# Patient Record
Sex: Male | Born: 1955 | Race: White | Hispanic: No | Marital: Married | Smoking: Never smoker
Health system: Southern US, Community
[De-identification: ages and names within clinical notes are randomized; demographics above are authoritative.]

## PROBLEM LIST (undated history)

## (undated) DIAGNOSIS — E785 Hyperlipidemia, unspecified: Secondary | ICD-10-CM

## (undated) DIAGNOSIS — F1010 Alcohol abuse, uncomplicated: Secondary | ICD-10-CM

## (undated) DIAGNOSIS — I447 Left bundle-branch block, unspecified: Secondary | ICD-10-CM

## (undated) DIAGNOSIS — F32A Depression, unspecified: Secondary | ICD-10-CM

## (undated) DIAGNOSIS — E349 Endocrine disorder, unspecified: Secondary | ICD-10-CM

## (undated) DIAGNOSIS — K222 Esophageal obstruction: Secondary | ICD-10-CM

## (undated) DIAGNOSIS — G4700 Insomnia, unspecified: Secondary | ICD-10-CM

## (undated) DIAGNOSIS — I10 Essential (primary) hypertension: Secondary | ICD-10-CM

## (undated) DIAGNOSIS — K2270 Barrett's esophagus without dysplasia: Secondary | ICD-10-CM

## (undated) DIAGNOSIS — B029 Zoster without complications: Secondary | ICD-10-CM

## (undated) DIAGNOSIS — Z8719 Personal history of other diseases of the digestive system: Secondary | ICD-10-CM

## (undated) DIAGNOSIS — K5792 Diverticulitis of intestine, part unspecified, without perforation or abscess without bleeding: Secondary | ICD-10-CM

## (undated) DIAGNOSIS — K219 Gastro-esophageal reflux disease without esophagitis: Secondary | ICD-10-CM

## (undated) DIAGNOSIS — K635 Polyp of colon: Secondary | ICD-10-CM

## (undated) DIAGNOSIS — M542 Cervicalgia: Secondary | ICD-10-CM

## (undated) DIAGNOSIS — J45909 Unspecified asthma, uncomplicated: Secondary | ICD-10-CM

## (undated) DIAGNOSIS — F329 Major depressive disorder, single episode, unspecified: Secondary | ICD-10-CM

## (undated) DIAGNOSIS — K759 Inflammatory liver disease, unspecified: Secondary | ICD-10-CM

## (undated) HISTORY — PX: NEUROPLASTY / TRANSPOSITION ULNAR NERVE AT ELBOW: SUR895

## (undated) HISTORY — PX: COLONOSCOPY: SHX5424

## (undated) HISTORY — PX: NEURECTOMY CERVICAL: SUR886

---

## 2016-03-25 ENCOUNTER — Encounter: Payer: Self-pay | Admitting: Anesthesiology

## 2016-03-25 ENCOUNTER — Encounter
Admission: RE | Disposition: A | Discharge status: Home / Self Care | Payer: Self-pay | Source: Ambulatory Visit | Attending: Gastroenterology

## 2016-03-25 ENCOUNTER — Ambulatory Visit
Admission: RE | Admit: 2016-03-25 | Discharge: 2016-03-25 | Disposition: A | Discharge status: Home / Self Care | Payer: BLUE CROSS/BLUE SHIELD | Source: Ambulatory Visit | Attending: Gastroenterology | Admitting: Gastroenterology

## 2016-03-25 ENCOUNTER — Ambulatory Visit: Payer: BLUE CROSS/BLUE SHIELD | Admitting: Anesthesiology

## 2016-03-25 DIAGNOSIS — E785 Hyperlipidemia, unspecified: Secondary | ICD-10-CM | POA: Diagnosis not present

## 2016-03-25 DIAGNOSIS — K5730 Diverticulosis of large intestine without perforation or abscess without bleeding: Secondary | ICD-10-CM | POA: Diagnosis not present

## 2016-03-25 DIAGNOSIS — K2980 Duodenitis without bleeding: Secondary | ICD-10-CM | POA: Insufficient documentation

## 2016-03-25 DIAGNOSIS — Z888 Allergy status to other drugs, medicaments and biological substances status: Secondary | ICD-10-CM | POA: Diagnosis not present

## 2016-03-25 DIAGNOSIS — Z86010 Personal history of colonic polyps: Secondary | ICD-10-CM | POA: Diagnosis not present

## 2016-03-25 DIAGNOSIS — K219 Gastro-esophageal reflux disease without esophagitis: Secondary | ICD-10-CM | POA: Insufficient documentation

## 2016-03-25 DIAGNOSIS — K2270 Barrett's esophagus without dysplasia: Secondary | ICD-10-CM | POA: Insufficient documentation

## 2016-03-25 DIAGNOSIS — Z91048 Other nonmedicinal substance allergy status: Secondary | ICD-10-CM | POA: Diagnosis not present

## 2016-03-25 DIAGNOSIS — F329 Major depressive disorder, single episode, unspecified: Secondary | ICD-10-CM | POA: Insufficient documentation

## 2016-03-25 DIAGNOSIS — R194 Change in bowel habit: Secondary | ICD-10-CM | POA: Diagnosis present

## 2016-03-25 DIAGNOSIS — Z885 Allergy status to narcotic agent status: Secondary | ICD-10-CM | POA: Diagnosis not present

## 2016-03-25 DIAGNOSIS — Z79899 Other long term (current) drug therapy: Secondary | ICD-10-CM | POA: Diagnosis not present

## 2016-03-25 DIAGNOSIS — K2950 Unspecified chronic gastritis without bleeding: Secondary | ICD-10-CM | POA: Insufficient documentation

## 2016-03-25 DIAGNOSIS — G4700 Insomnia, unspecified: Secondary | ICD-10-CM | POA: Diagnosis not present

## 2016-03-25 DIAGNOSIS — K449 Diaphragmatic hernia without obstruction or gangrene: Secondary | ICD-10-CM | POA: Insufficient documentation

## 2016-03-25 DIAGNOSIS — I10 Essential (primary) hypertension: Secondary | ICD-10-CM | POA: Insufficient documentation

## 2016-03-25 DIAGNOSIS — R197 Diarrhea, unspecified: Secondary | ICD-10-CM | POA: Insufficient documentation

## 2016-03-25 DIAGNOSIS — R110 Nausea: Secondary | ICD-10-CM | POA: Insufficient documentation

## 2016-03-25 DIAGNOSIS — J45909 Unspecified asthma, uncomplicated: Secondary | ICD-10-CM | POA: Insufficient documentation

## 2016-03-25 DIAGNOSIS — Z8619 Personal history of other infectious and parasitic diseases: Secondary | ICD-10-CM | POA: Insufficient documentation

## 2016-03-25 DIAGNOSIS — K3189 Other diseases of stomach and duodenum: Secondary | ICD-10-CM | POA: Diagnosis not present

## 2016-03-25 HISTORY — DX: Alcohol abuse, uncomplicated: F10.10

## 2016-03-25 HISTORY — PX: ESOPHAGOGASTRODUODENOSCOPY (EGD) WITH PROPOFOL: SHX5813

## 2016-03-25 HISTORY — DX: Esophageal obstruction: K22.2

## 2016-03-25 HISTORY — DX: Gastro-esophageal reflux disease without esophagitis: K21.9

## 2016-03-25 HISTORY — DX: Major depressive disorder, single episode, unspecified: F32.9

## 2016-03-25 HISTORY — DX: Insomnia, unspecified: G47.00

## 2016-03-25 HISTORY — DX: Depression, unspecified: F32.A

## 2016-03-25 HISTORY — DX: Unspecified asthma, uncomplicated: J45.909

## 2016-03-25 HISTORY — DX: Cervicalgia: M54.2

## 2016-03-25 HISTORY — DX: Hyperlipidemia, unspecified: E78.5

## 2016-03-25 HISTORY — DX: Endocrine disorder, unspecified: E34.9

## 2016-03-25 HISTORY — DX: Barrett's esophagus without dysplasia: K22.70

## 2016-03-25 HISTORY — PX: COLONOSCOPY WITH PROPOFOL: SHX5780

## 2016-03-25 HISTORY — DX: Diverticulitis of intestine, part unspecified, without perforation or abscess without bleeding: K57.92

## 2016-03-25 HISTORY — DX: Personal history of other diseases of the digestive system: Z87.19

## 2016-03-25 HISTORY — DX: Polyp of colon: K63.5

## 2016-03-25 HISTORY — DX: Inflammatory liver disease, unspecified: K75.9

## 2016-03-25 HISTORY — DX: Essential (primary) hypertension: I10

## 2016-03-25 HISTORY — DX: Left bundle-branch block, unspecified: I44.7

## 2016-03-25 HISTORY — DX: Zoster without complications: B02.9

## 2016-03-25 LAB — CBC WITH DIFFERENTIAL/PLATELET
BASOS ABS: 0 10*3/uL (ref 0–0.1)
BASOS PCT: 1 %
EOS ABS: 0.1 10*3/uL (ref 0–0.7)
EOS PCT: 1 %
HCT: 42.3 % (ref 40.0–52.0)
Hemoglobin: 14.8 g/dL (ref 13.0–18.0)
Lymphocytes Relative: 22 %
Lymphs Abs: 1.1 10*3/uL (ref 1.0–3.6)
MCH: 32.4 pg (ref 26.0–34.0)
MCHC: 35 g/dL (ref 32.0–36.0)
MCV: 92.4 fL (ref 80.0–100.0)
MONO ABS: 0.5 10*3/uL (ref 0.2–1.0)
Monocytes Relative: 9 %
Neutro Abs: 3.5 10*3/uL (ref 1.4–6.5)
Neutrophils Relative %: 67 %
PLATELETS: 292 10*3/uL (ref 150–440)
RBC: 4.58 MIL/uL (ref 4.40–5.90)
RDW: 12.9 % (ref 11.5–14.5)
WBC: 5.2 10*3/uL (ref 3.8–10.6)

## 2016-03-25 LAB — PROTIME-INR
INR: 1
PROTHROMBIN TIME: 13.2 s (ref 11.4–15.2)

## 2016-03-25 SURGERY — COLONOSCOPY WITH PROPOFOL
Anesthesia: General

## 2016-03-25 MED ORDER — LIDOCAINE 2% (20 MG/ML) 5 ML SYRINGE
INTRAMUSCULAR | Status: DC | PRN
Start: 2016-03-25 — End: 2016-03-25
  Administered 2016-03-25: 25 mg via INTRAVENOUS

## 2016-03-25 MED ORDER — IPRATROPIUM-ALBUTEROL 0.5-2.5 (3) MG/3ML IN SOLN
3.0000 mL | Freq: Once | RESPIRATORY_TRACT | Status: AC
  Administered 2016-03-25: 0.03 mL via RESPIRATORY_TRACT
  Filled 2016-03-25: qty 3

## 2016-03-25 MED ORDER — PROPOFOL 500 MG/50ML IV EMUL
INTRAVENOUS | Status: AC
  Filled 2016-03-25: qty 50

## 2016-03-25 MED ORDER — FENTANYL CITRATE (PF) 100 MCG/2ML IJ SOLN
INTRAMUSCULAR | Status: DC | PRN
  Administered 2016-03-25: 50 ug via INTRAVENOUS

## 2016-03-25 MED ORDER — GLYCOPYRROLATE 0.2 MG/ML IJ SOLN
INTRAMUSCULAR | Status: AC
  Filled 2016-03-25: qty 1

## 2016-03-25 MED ORDER — PROPOFOL 500 MG/50ML IV EMUL
INTRAVENOUS | Status: DC | PRN
  Administered 2016-03-25: 120 ug/kg/min via INTRAVENOUS

## 2016-03-25 MED ORDER — SODIUM CHLORIDE 0.9 % IV SOLN: INTRAVENOUS | Status: DC

## 2016-03-25 MED ORDER — MIDAZOLAM HCL 2 MG/2ML IJ SOLN
INTRAMUSCULAR | Status: AC
  Filled 2016-03-25: qty 2

## 2016-03-25 MED ORDER — FENTANYL CITRATE (PF) 100 MCG/2ML IJ SOLN
INTRAMUSCULAR | Status: AC
  Filled 2016-03-25: qty 2

## 2016-03-25 MED ORDER — SODIUM CHLORIDE 0.9 % IV SOLN
INTRAVENOUS | Status: DC
  Administered 2016-03-25: 1000 mL via INTRAVENOUS

## 2016-03-25 MED ORDER — MIDAZOLAM HCL 5 MG/5ML IJ SOLN
INTRAMUSCULAR | Status: DC | PRN
  Administered 2016-03-25: 1 mg via INTRAVENOUS

## 2016-03-25 MED ORDER — LIDOCAINE HCL (PF) 1 % IJ SOLN
INTRAMUSCULAR | Status: AC
  Filled 2016-03-25: qty 2

## 2016-03-25 MED ORDER — PROPOFOL 10 MG/ML IV BOLUS
INTRAVENOUS | Status: DC | PRN
Start: 2016-03-25 — End: 2016-03-25
  Administered 2016-03-25: 70 mg via INTRAVENOUS
  Administered 2016-03-25: 30 mg via INTRAVENOUS

## 2016-03-25 MED ORDER — GLYCOPYRROLATE 0.2 MG/ML IJ SOLN
INTRAMUSCULAR | Status: DC | PRN
  Administered 2016-03-25: 0.2 mg via INTRAVENOUS

## 2016-03-25 MED ORDER — SODIUM CHLORIDE 0.9 % IV SOLN
INTRAVENOUS | Status: DC
  Administered 2016-03-25: 0.03 mL via INTRAVENOUS

## 2016-03-25 NOTE — Op Note (Addendum)
[facility] Patient Name: Earl Stewart Procedure Date: 03/25/2016 10:02 AM MRN: [id number] Account #: [id number] Date of Birth: 09/10/1955 Admit Type: Outpatient Age: 61 Room: [facility] ENDO ROOM 3 Gender: Male Note Status: Finalized Procedure:            Upper GI endoscopy Indications:          Dyspepsia, Follow-up of Barrett's esophagus, Nausea                        with vomiting Providers:            Christena Deem, MD Referring MD:         [name] (Referring MD) Medicines:            Monitored Anesthesia Care Complications:        No immediate complications. Procedure:            Pre-Anesthesia Assessment:                       - ASA Grade Assessment: III - A patient with severe                        systemic disease.                       After obtaining informed consent, the endoscope was                        passed under direct vision. Throughout the procedure,                        the patient's blood pressure, pulse, and oxygen                        saturations were monitored continuously. The Endoscope                        was introduced through the mouth, and advanced to the                        third part of duodenum. The upper GI endoscopy was                        accomplished without difficulty. The patient tolerated                        the procedure well. Findings:      There were esophageal mucosal changes consistent with short-segment       Barrett's esophagus present at the gastroesophageal junction. The       maximum longitudinal extent of these mucosal changes was 1 cm in length.       Mucosa was biopsied with a cold forceps for histology in 4 quadrants.       One specimen bottle was sent to pathology.      The exam of the esophagus was otherwise normal.      Diffuse mild inflammation characterized by congestion (edema) and       erythema was found in the gastric body. Biopsies were taken with a cold        forceps for histology. Biopsies were taken with a cold forceps for  Helicobacter pylori testing.      Patchy mild inflammation characterized by erythema was found in the       duodenal bulb.      Diffuse mucosal flattening was found in the second portion of the       duodenum and in the third portion of the duodenum. Biopsies were taken       with a cold forceps for histology.      --Initial biopsies of the antrum were mistakenly placed in the duodenum       speciman jar. Repeat taken. Impression:           - Esophageal mucosal changes consistent with                        short-segment Barrett's esophagus. Biopsied.                       - Gastritis. Biopsied.                       - Duodenitis.                       - Flattened mucosa was found in the duodenum, rule out                        celiac disease. Biopsied. Recommendation:       - Continue present medications. Procedure Code(s):    --- Professional ---                       [phone], Esophagogastroduodenoscopy, flexible, transoral;                        with biopsy, single or multiple Diagnosis Code(s):    --- Professional ---                       K22.70, Barrett's esophagus without dysplasia                       K29.70, Gastritis, unspecified, without bleeding                       K29.80, Duodenitis without bleeding                       K31.89, Other diseases of stomach and duodenum                       R10.13, Epigastric pain                       R11.2, Nausea with vomiting, unspecified CPT copyright 2016 American Medical Association. All rights reserved. The codes documented in this report are preliminary and upon coder review may  be revised to meet current compliance requirements. Christena DeemMartin U Loel Betancur, MD 03/25/2016 10:33:26 AM This report has been signed electronically. Number of Addenda: 0 Note Initiated On: 03/25/2016 10:02 AM      [facility]

## 2016-03-25 NOTE — Anesthesia Postprocedure Evaluation (Signed)
Anesthesia Post Note  Patient: Earl JulianWilliam Stewart  Procedure(s) Performed: Procedure(s) (LRB): COLONOSCOPY WITH PROPOFOL (N/A) ESOPHAGOGASTRODUODENOSCOPY (EGD) WITH PROPOFOL (N/A)  Patient location during evaluation: Endoscopy Anesthesia Type: General Level of consciousness: awake and alert Pain management: pain level controlled Vital Signs Assessment: post-procedure vital signs reviewed and stable Respiratory status: spontaneous breathing, nonlabored ventilation, respiratory function stable and patient connected to nasal cannula oxygen Cardiovascular status: blood pressure returned to baseline and stable Postop Assessment: no signs of nausea or vomiting Anesthetic complications: no     Last Vitals:  Vitals:   03/25/16 1114 03/25/16 1134  BP:    Pulse: 72 64  Resp: (!) 21 11  Temp:      Last Pain:  Vitals:   03/25/16 1109  TempSrc: Tympanic                 Cleda MccreedyJoseph K Sheanna Dail

## 2016-03-25 NOTE — H&P (Signed)
Outpatient short stay form Pre-procedure 03/25/2016 10:03 AM Christena Deem MD  Primary Physician: Dr. [name]  Reason for visit:  EGD and colonoscopy  History of present illness:  Patient is a 61 year old male presenting today as above. He has about a 6 month history of waking in the morning with nausea and dry heaving last from one to 3 hours. Does have issues with sinus drainage. Also had a bowel habit change that is quite variable sometimes loose sometimes not. He has not seen any blood or black stools. He tolerated his prep well. He takes no aspirin or blood thinning agents. He has a history of hepatitis C that was treated about 20 years ago with interferon and ribavirin and this did give him sustained remission. INR today was 1.0, CBC showed a normal counts with platelets of 292. Further patient does have a history of Barrett's esophagus seen on his previous endoscopy. He has no dysphagia currently and is taking a daily proton pump inhibitor.    Current Facility-Administered Medications:  .  0.9 %  sodium chloride infusion, , Intravenous, Continuous, Christena Deem, MD, Last Rate: 20 mL/hr at 03/25/16 0907, 0.03 mL at 03/25/16 0907 .  0.9 %  sodium chloride infusion, , Intravenous, Continuous, Christena Deem, MD, Last Rate: 20 mL/hr at 03/25/16 0906, 1,000 mL at 03/25/16 0906 .  0.9 %  sodium chloride infusion, , Intravenous, Continuous, Christena Deem, MD .  lidocaine (PF) (XYLOCAINE) 1 % injection, , , ,   Prescriptions Prior to Admission  Medication Sig Dispense Refill Last Dose  . albuterol (PROVENTIL HFA;VENTOLIN HFA) 108 (90 Base) MCG/ACT inhaler Inhale 2 puffs into the lungs every 6 (six) hours as needed for wheezing or shortness of breath.     Marland Kitchen HYDROcodone-acetaminophen (NORCO/VICODIN) 5-325 MG tablet Take 1 tablet by mouth every 8 (eight) hours as needed for moderate pain.     Marland Kitchen lisinopril (PRINIVIL,ZESTRIL) 20 MG tablet Take 20 mg by mouth daily.   0530  .  omeprazole (PRILOSEC) 20 MG capsule Take 20 mg by mouth 2 (two) times daily before a meal.     . simvastatin (ZOCOR) 40 MG tablet Take 40 mg by mouth daily.     . traZODone (DESYREL) 50 MG tablet Take 50-100 mg by mouth at bedtime.     Marland Kitchen zolpidem (AMBIEN) 10 MG tablet Take 10 mg by mouth at bedtime as needed for sleep.        Allergies  Allergen Reactions  . Cymbalta [Duloxetine Hcl]   . Mold Extract [Trichophyton]   . Ultram [Tramadol Hcl]      Past Medical History:  Diagnosis Date  . Alcohol abuse   . Asthma   . Barrett's esophagus   . Cervicalgia   . Colon polyps   . Depression   . Diverticulitis   . GERD (gastroesophageal reflux disease)   . Hepatitis    hepatitis c  . History of hiatal hernia   . Hyperlipidemia   . Hypertension   . Insomnia   . LBBB (left bundle branch block)   . Schatzki's ring   . Shingles   . Testosterone insufficiency     Review of systems:      Physical Exam    Heart and lungs: Regular rate and rhythm without rub or gallop, lungs are bilaterally clear.    HEENT: Normocephalic atraumatic eyes are anicteric    Other:     Pertinant exam for procedure: Soft nontender nondistended bowel sounds  positive normoactive.    Planned proceedures: EGD, colonoscopy and indicated procedures. I have discussed the risks benefits and complications of procedures to include not limited to bleeding, infection, perforation and the risk of sedation and the patient wishes to proceed.    Christena DeemMartin U Lemon Sternberg, MD Gastroenterology 03/25/2016  10:03 AM

## 2016-03-25 NOTE — Op Note (Signed)
[facility] Patient Name: Earl Stewart Procedure Date: 03/25/2016 10:04 AM MRN: [id number] Account #: [id number] Date of Birth: 1955-12-27 Admit Type: Outpatient Age: 61 Room: [facility] ENDO ROOM 3 Gender: Male Note Status: Finalized Procedure:            Colonoscopy Indications:          Change in bowel habits, Diarrhea Providers:            Christena Deem, MD Referring MD:         [name] (Referring MD) Medicines:            Monitored Anesthesia Care Complications:        No immediate complications. Procedure:            Pre-Anesthesia Assessment:                       - ASA Grade Assessment: III - A patient with severe                        systemic disease.                       After obtaining informed consent, the colonoscope was                        passed under direct vision. Throughout the procedure,                        the patient's blood pressure, pulse, and oxygen                        saturations were monitored continuously. The                        Colonoscope was introduced through the anus and                        advanced to the the cecum, identified by appendiceal                        orifice and ileocecal valve. The colonoscopy was                        performed with moderate difficulty due to significant                        looping. Successful completion of the procedure was                        aided by changing the patient to a supine position and                        using manual pressure. The patient tolerated the                        procedure well. The quality of the bowel preparation                        was good. Findings:      Multiple small and large-mouthed diverticula were found in the sigmoid  colon and descending colon.      The exam was otherwise normal throughout the examined colon.      The retroflexed view of the distal rectum and anal verge was normal and   showed no anal or rectal abnormalities.      The digital rectal exam was normal. Impression:           - Diverticulosis in the sigmoid colon and in the                        descending colon.                       - The distal rectum and anal verge are normal on                        retroflexion view.                       - No specimens collected. Recommendation:       - Discharge patient to home. Procedure Code(s):    --- Professional ---                       [phone], Colonoscopy, flexible; diagnostic, including                        collection of specimen(s) by brushing or washing, when                        performed (separate procedure) Diagnosis Code(s):    --- Professional ---                       R19.4, Change in bowel habit                       R19.7, Diarrhea, unspecified                       K57.30, Diverticulosis of large intestine without                        perforation or abscess without bleeding CPT copyright 2016 American Medical Association. All rights reserved. The codes documented in this report are preliminary and upon coder review may  be revised to meet current compliance requirements. Christena DeemMartin U Alhaji Mcneal, MD 03/25/2016 11:02:34 AM This report has been signed electronically. Number of Addenda: 0 Note Initiated On: 03/25/2016 10:04 AM Scope Withdrawal Time: 0 hours 8 minutes 38 seconds  Total Procedure Duration: 0 hours 22 minutes 28 seconds       [facility]

## 2016-03-25 NOTE — Anesthesia Preprocedure Evaluation (Signed)
Anesthesia Evaluation  Patient identified by MRN, date of birth, ID band Patient awake    Reviewed: Allergy & Precautions, H&P , NPO status , Patient's Chart, lab work & pertinent test results  History of Anesthesia Complications Negative for: history of anesthetic complications  Airway Mallampati: II  TM Distance: >3 FB Neck ROM: full    Dental  (+) Poor Dentition, Chipped   Pulmonary neg shortness of breath, asthma ,    Pulmonary exam normal breath sounds clear to auscultation       Cardiovascular Exercise Tolerance: Good hypertension, (-) angina(-) Past MI and (-) DOE Normal cardiovascular exam+ dysrhythmias  Rhythm:regular Rate:Normal     Neuro/Psych PSYCHIATRIC DISORDERS Depression negative neurological ROS     GI/Hepatic Neg liver ROS, hiatal hernia, GERD  Controlled,(+) Hepatitis -, C  Endo/Other  negative endocrine ROS  Renal/GU negative Renal ROS  negative genitourinary   Musculoskeletal   Abdominal   Peds  Hematology negative hematology ROS (+)   Anesthesia Other Findings Past Medical History: No date: Alcohol abuse No date: Asthma No date: Barrett's esophagus No date: Cervicalgia No date: Colon polyps No date: Depression No date: Diverticulitis No date: GERD (gastroesophageal reflux disease) No date: Hepatitis     Comment: hepatitis c No date: History of hiatal hernia No date: Hyperlipidemia No date: Hypertension No date: Insomnia No date: LBBB (left bundle branch block) No date: Schatzki's ring No date: Shingles No date: Testosterone insufficiency  Past Surgical History: No date: COLONOSCOPY No date: NEURECTOMY CERVICAL No date: NEUROPLASTY / TRANSPOSITION ULNAR NERVE AT ELB*     Reproductive/Obstetrics negative OB ROS                             Anesthesia Physical Anesthesia Plan  ASA: III  Anesthesia Plan: General   Post-op Pain Management:     Induction:   Airway Management Planned:   Additional Equipment:   Intra-op Plan:   Post-operative Plan:   Informed Consent: I have reviewed the patients History and Physical, chart, labs and discussed the procedure including the risks, benefits and alternatives for the proposed anesthesia with the patient or authorized representative who has indicated his/her understanding and acceptance.   Dental Advisory Given  Plan Discussed with: Anesthesiologist, CRNA and Surgeon  Anesthesia Plan Comments:         Anesthesia Quick Evaluation

## 2016-03-25 NOTE — Transfer of Care (Signed)
Immediate Anesthesia Transfer of Care Note  Patient: Earl JulianWilliam Stewart  Procedure(s) Performed: Procedure(s): COLONOSCOPY WITH PROPOFOL (N/A) ESOPHAGOGASTRODUODENOSCOPY (EGD) WITH PROPOFOL (N/A)  Patient Location: Endoscopy Unit  Anesthesia Type:General  Level of Consciousness: awake and alert   Airway & Oxygen Therapy: Patient Spontanous Breathing and Patient connected to nasal cannula oxygen  Post-op Assessment: Report given to RN and Post -op Vital signs reviewed and stable  Post vital signs: Reviewed  Last Vitals:  Vitals:   03/25/16 0826 03/25/16 1104  BP: (!) 147/103 (!) 120/100  Pulse: 68 90  Resp: 17 16  Temp: 36.6 C (!) 35.8 C    Last Pain:  Vitals:   03/25/16 0826  TempSrc: Tympanic         Complications: No apparent anesthesia complications

## 2016-03-26 LAB — SURGICAL PATHOLOGY

## 2016-03-28 ENCOUNTER — Encounter: Payer: Self-pay | Admitting: Gastroenterology

## 2016-04-24 ENCOUNTER — Other Ambulatory Visit: Payer: Self-pay | Admitting: Gastroenterology

## 2016-04-24 DIAGNOSIS — R1013 Epigastric pain: Secondary | ICD-10-CM

## 2016-05-01 ENCOUNTER — Ambulatory Visit
Admission: RE | Admit: 2016-05-01 | Discharge: 2016-05-01 | Disposition: A | Discharge status: Home / Self Care | Payer: BLUE CROSS/BLUE SHIELD | Source: Ambulatory Visit | Attending: Gastroenterology | Admitting: Gastroenterology

## 2016-05-01 DIAGNOSIS — R1013 Epigastric pain: Secondary | ICD-10-CM | POA: Diagnosis present

## 2016-05-01 DIAGNOSIS — N200 Calculus of kidney: Secondary | ICD-10-CM | POA: Diagnosis not present

## 2016-05-01 MED ORDER — TECHNETIUM TC 99M MEBROFENIN IV KIT: 5.0000 | PACK | Freq: Once | INTRAVENOUS | Status: DC | PRN

## 2017-01-26 ENCOUNTER — Other Ambulatory Visit: Payer: Self-pay | Admitting: Gastroenterology

## 2017-01-26 DIAGNOSIS — N50819 Testicular pain, unspecified: Secondary | ICD-10-CM

## 2017-02-02 ENCOUNTER — Ambulatory Visit
Admission: RE | Admit: 2017-02-02 | Discharge: 2017-02-02 | Disposition: A | Discharge status: Home / Self Care | Payer: BLUE CROSS/BLUE SHIELD | Source: Ambulatory Visit | Attending: Gastroenterology | Admitting: Gastroenterology

## 2017-02-02 DIAGNOSIS — K4090 Unilateral inguinal hernia, without obstruction or gangrene, not specified as recurrent: Secondary | ICD-10-CM | POA: Insufficient documentation

## 2017-02-02 DIAGNOSIS — N50819 Testicular pain, unspecified: Secondary | ICD-10-CM | POA: Diagnosis present

## 2017-03-05 ENCOUNTER — Other Ambulatory Visit: Payer: Self-pay | Admitting: Gastroenterology

## 2017-03-05 DIAGNOSIS — N50819 Testicular pain, unspecified: Secondary | ICD-10-CM

## 2017-03-18 ENCOUNTER — Ambulatory Visit
Admission: RE | Admit: 2017-03-18 | Discharge: 2017-03-18 | Disposition: A | Discharge status: Home / Self Care | Payer: BLUE CROSS/BLUE SHIELD | Source: Ambulatory Visit | Attending: Gastroenterology | Admitting: Gastroenterology

## 2017-03-23 ENCOUNTER — Ambulatory Visit
Admission: RE | Admit: 2017-03-23 | Discharge: 2017-03-23 | Disposition: A | Discharge status: Home / Self Care | Payer: BLUE CROSS/BLUE SHIELD | Source: Ambulatory Visit | Attending: Gastroenterology | Admitting: Gastroenterology

## 2017-03-23 DIAGNOSIS — K5730 Diverticulosis of large intestine without perforation or abscess without bleeding: Secondary | ICD-10-CM | POA: Diagnosis not present

## 2017-03-23 DIAGNOSIS — N50819 Testicular pain, unspecified: Secondary | ICD-10-CM | POA: Diagnosis not present

## 2017-03-23 DIAGNOSIS — R1084 Generalized abdominal pain: Secondary | ICD-10-CM | POA: Diagnosis present

## 2017-03-23 DIAGNOSIS — I700 Atherosclerosis of aorta: Secondary | ICD-10-CM | POA: Insufficient documentation

## 2017-03-23 LAB — POCT I-STAT CREATININE: Creatinine, Ser: 1.1 mg/dL (ref 0.61–1.24)

## 2017-03-23 MED ORDER — IOPAMIDOL (ISOVUE-300) INJECTION 61%
100.0000 mL | Freq: Once | INTRAVENOUS | Status: AC | PRN
  Administered 2017-03-23: 100 mL via INTRAVENOUS

## 2018-05-17 IMAGING — CT CT ABD-PELV W/ CM
2 of 5 series · 12 of 46 positions shown, 14 images · IV contrast (iopamidol)
Comparison: None.

CLINICAL DATA: Lower abdominal pain.

EXAM:
CT ABDOMEN AND PELVIS WITH CONTRAST
TECHNIQUE: Multidetector CT imaging of the abdomen and pelvis was performed
using the standard protocol following bolus administration of
intravenous contrast.
CONTRAST:  100mL QSUXZR-GAA IOPAMIDOL (QSUXZR-GAA) INJECTION 61%

[Series 2: abdomen 5.00 br40 s3 ax (id) iso 300 · axial · 0.53mm/px · z∈[-1548,-1113]mm · 9 of 103 slices shown, 11 images]
[im 8/103  soft-tissue]
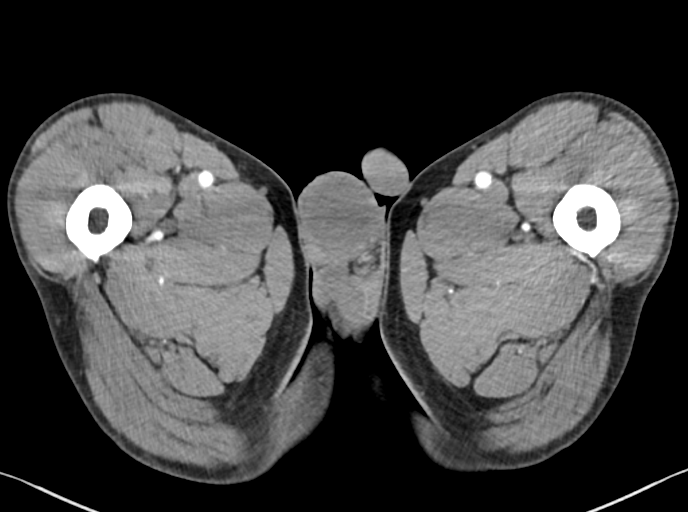
[im 8/103  bone]
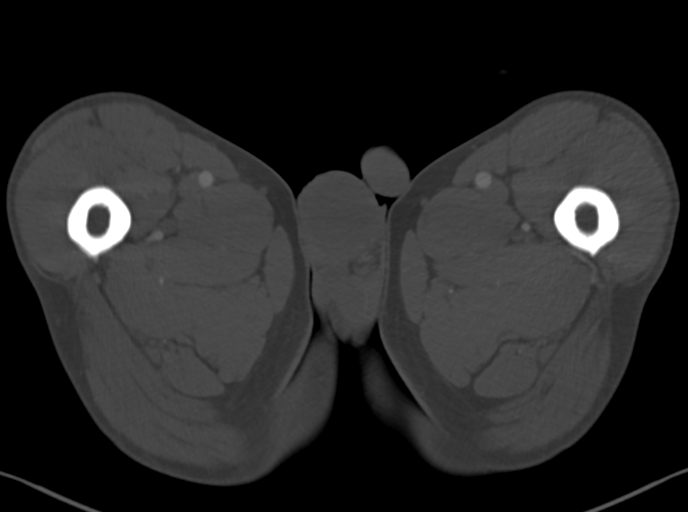
[im 24/103  soft-tissue]
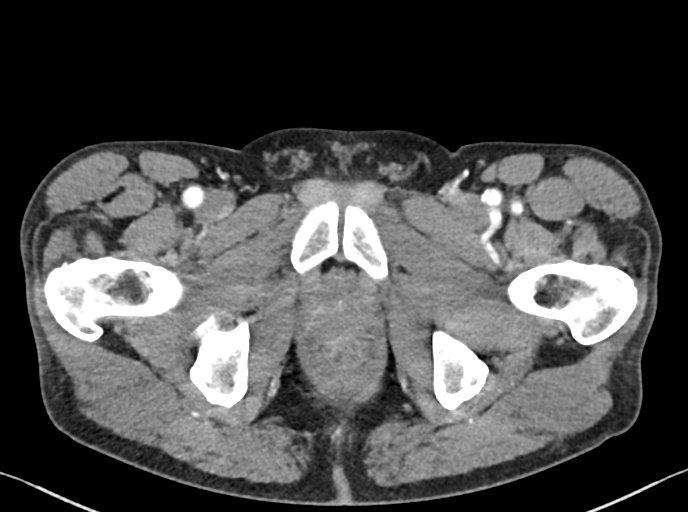
[im 32/103  soft-tissue]
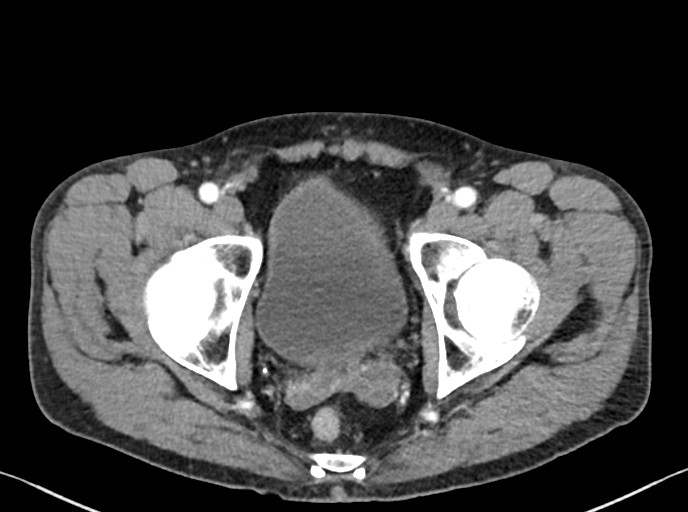
[im 40/103  soft-tissue]
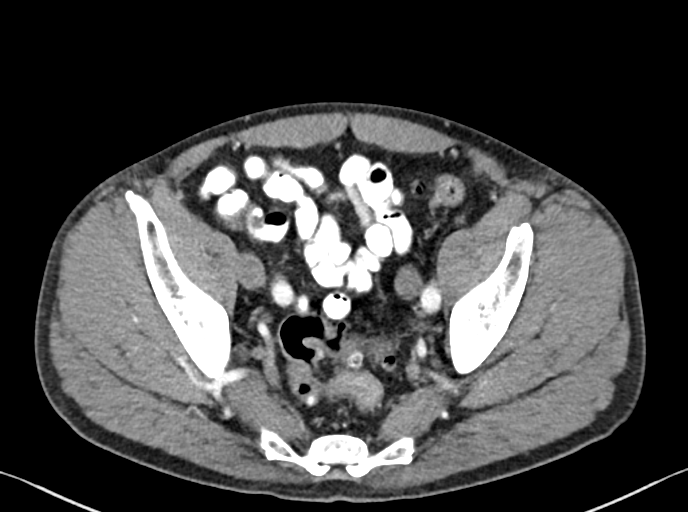
[im 55/103  soft-tissue]
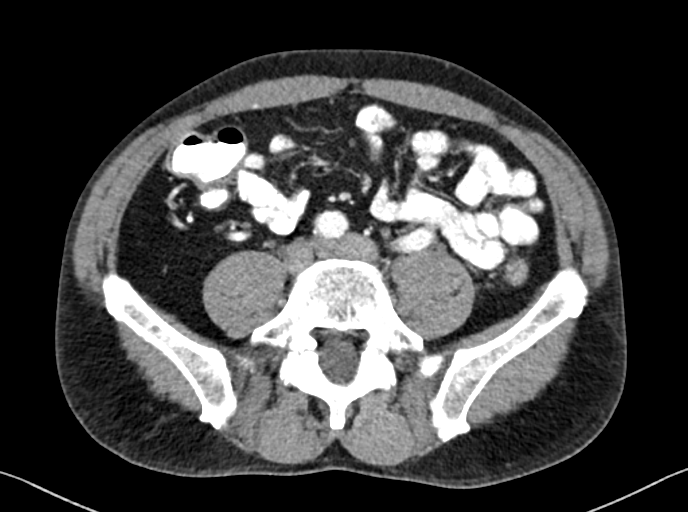
[im 63/103  soft-tissue]
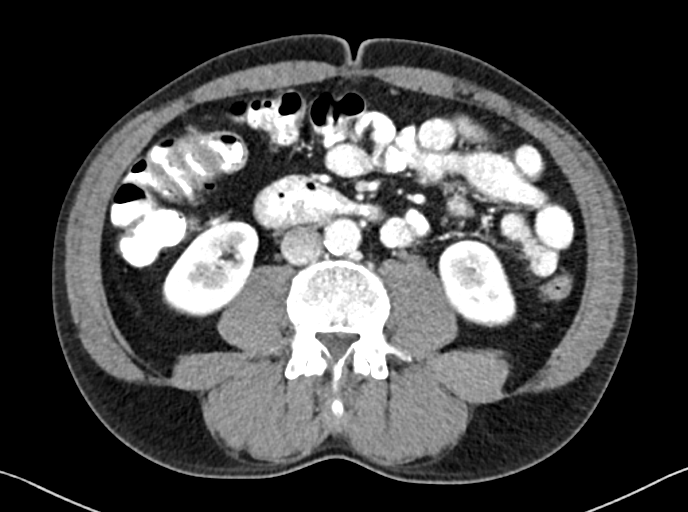
[im 71/103  soft-tissue]
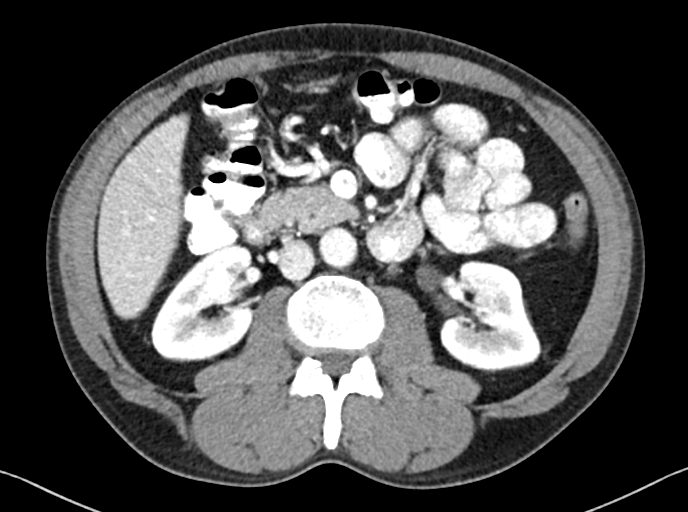
[im 87/103  soft-tissue]
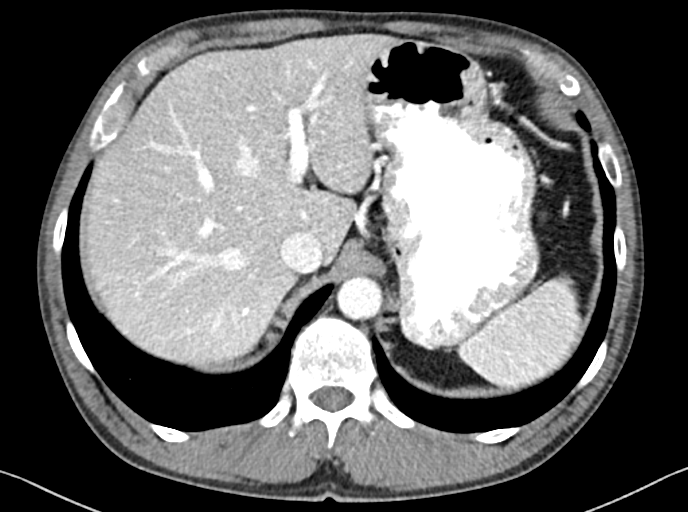
[im 95/103  soft-tissue]
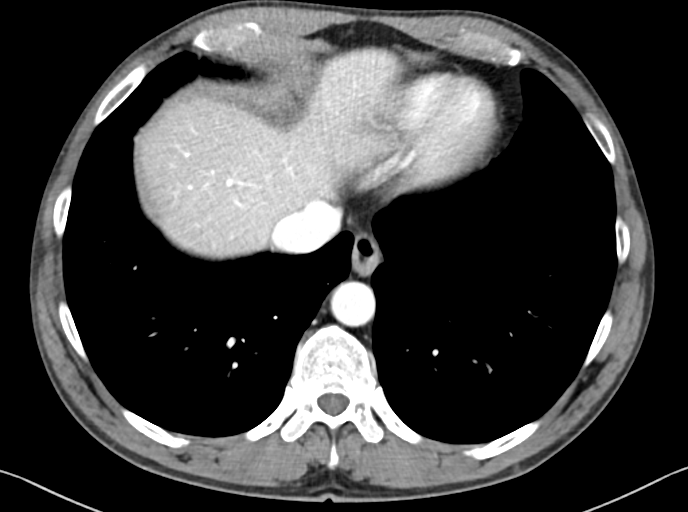
[im 95/103  bone]
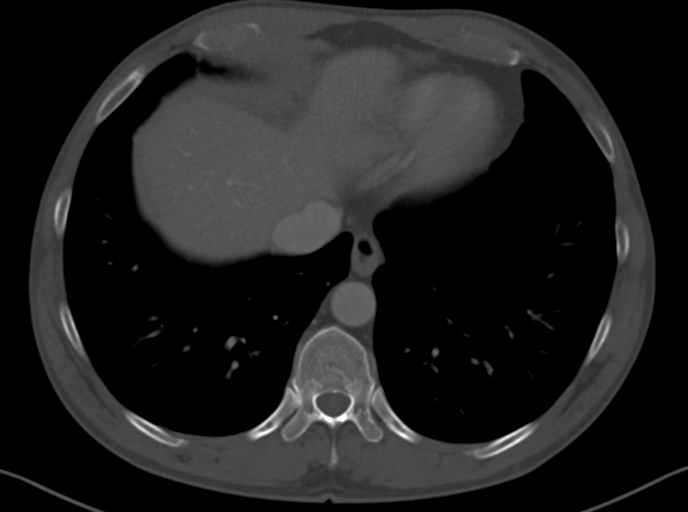

[Series 8: abdomen 2.00 br40 s3 cor (id) iso 300 · coronal · 0.71mm/px · 3 of 134 slices shown]
[im 45/134  soft-tissue]
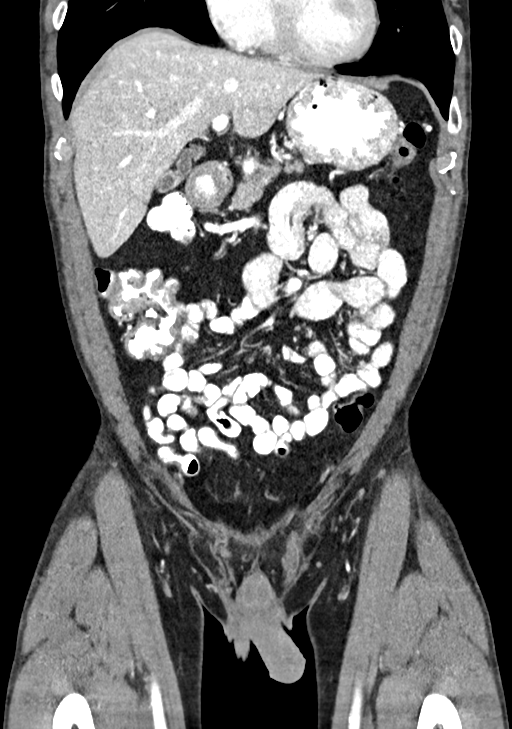
[im 60/134  soft-tissue]
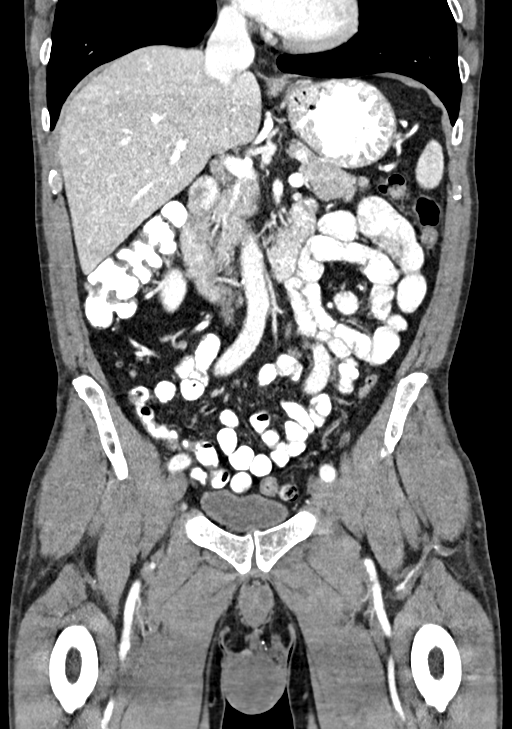
[im 74/134  soft-tissue]
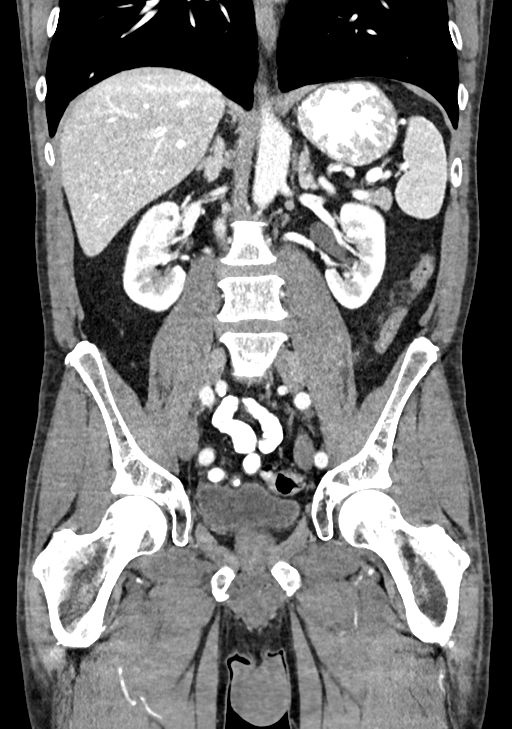

[12 of 46 positions shown; findings below may reference images not displayed]

FINDINGS: Lower chest: No acute abnormality.

Hepatobiliary: No focal liver abnormality is seen. No gallstones,
gallbladder wall thickening, or biliary dilatation.

Pancreas: Unremarkable. No pancreatic ductal dilatation or
surrounding inflammatory changes.

Spleen: Normal in size without focal abnormality.

Adrenals/Urinary Tract: Normal adrenal glands.

The kidneys are unremarkable. No mass or hydronephrosis is
identified. Urinary bladder is normal.

Stomach/Bowel: Stomach is within normal limits. Appendix appears
normal. Extensive distal colonic diverticulosis identified. No acute
inflammation.

Vascular/Lymphatic: Aortic atherosclerosis. No aneurysm. No enlarged
lymph nodes within the upper abdomen. No pelvic or inguinal
adenopathy.

Reproductive: Prostate gland measures 5.3 x 4.1 x 3.9 cm (volume =
44 cm^3).

Other: No abdominal wall hernia or abnormality. No abdominopelvic
ascites.

Musculoskeletal: No acute or significant osseous findings.
IMPRESSION: 1. No acute findings within the abdomen or pelvis.
2. Extensive distal colonic diverticulosis without acute
inflammation.
3.  Aortic Atherosclerosis ([id number].V).

## 2022-06-30 ENCOUNTER — Ambulatory Visit
Admission: EM | Admit: 2022-06-30 | Discharge: 2022-06-30 | Disposition: A | Discharge status: Home / Self Care | Payer: BC Managed Care – PPO | Attending: Emergency Medicine | Admitting: Emergency Medicine

## 2022-06-30 DIAGNOSIS — H109 Unspecified conjunctivitis: Secondary | ICD-10-CM | POA: Diagnosis not present

## 2022-06-30 MED ORDER — MOXIFLOXACIN HCL 0.5 % OP SOLN: 1.0000 [drp] | Freq: Three times a day (TID) | OPHTHALMIC | 0 refills | Status: AC

## 2022-06-30 NOTE — ED Triage Notes (Signed)
Pt c/o RT eye itchy, red onset x7-10 days, discharge from it in the mornings

## 2022-06-30 NOTE — Discharge Instructions (Signed)
Today you being treated for bacterial conjunctivitis.   Place one drop Moxifloxacin every 8 hours into the right eye. If the other eye starts to have symptoms you may use medication in it as well. Do not allow tip of dropper to touch eye.  May use cool compress for comfort and to remove discharge if present. Pat the eye, do not wipe.  Do not rub eyes, this may cause more irritation.  May use benadryl as needed to help if itching present.  If symptoms persist after use of medication, please follow up at Urgent Care or with ophthalmologist (eye doctor)

## 2022-06-30 NOTE — ED Provider Notes (Signed)
[facility]    CSN: [id number] Arrival date & time: 06/30/22  0954      History   Chief Complaint Chief Complaint  Patient presents with   Eye Problem    HPI Earl Stewart is a 67 y.o. male.   Presents for evaluation of right erythema, pruritus and drainage present for 10 days.  Drainage is primarily in the morning with crusting, endorses a film over the eye that caused blurred vision for approximately 10 minutes before resolution.  Initially thought was allergies and attempted use of Zaditor eyedrops which have been ineffective.  Denies light sensitivity, injury or trauma, pain.  Past Medical History:  Diagnosis Date   Alcohol abuse    Asthma    Barrett's esophagus    Cervicalgia    Colon polyps    Depression    Diverticulitis    GERD (gastroesophageal reflux disease)    Hepatitis    hepatitis c   History of hiatal hernia    Hyperlipidemia    Hypertension    Insomnia    LBBB (left bundle branch block)    Schatzki's ring    Shingles    Testosterone insufficiency     There are no problems to display for this patient.   Past Surgical History:  Procedure Laterality Date   COLONOSCOPY     COLONOSCOPY WITH PROPOFOL N/A 03/25/2016   Procedure: COLONOSCOPY WITH PROPOFOL;  Surgeon: Christena Deem, MD;  Location: [facility] ENDOSCOPY;  Service: Endoscopy;  Laterality: N/A;   ESOPHAGOGASTRODUODENOSCOPY (EGD) WITH PROPOFOL N/A 03/25/2016   Procedure: ESOPHAGOGASTRODUODENOSCOPY (EGD) WITH PROPOFOL;  Surgeon: Christena Deem, MD;  Location: [facility] ENDOSCOPY;  Service: Endoscopy;  Laterality: N/A;   NEURECTOMY CERVICAL     NEUROPLASTY / TRANSPOSITION ULNAR NERVE AT ELBOW         Home Medications    Prior to Admission medications   Medication Sig Start Date End Date Taking? Authorizing Provider  albuterol (PROVENTIL HFA;VENTOLIN HFA) 108 (90 Base) MCG/ACT inhaler Inhale 2 puffs into the lungs every 6 (six) hours as needed for wheezing or shortness of  breath.    [provider]  HYDROcodone-acetaminophen (NORCO/VICODIN) 5-325 MG tablet Take 1 tablet by mouth every 8 (eight) hours as needed for moderate pain.    [provider]  lisinopril (PRINIVIL,ZESTRIL) 20 MG tablet Take 20 mg by mouth daily.    [provider]  omeprazole (PRILOSEC) 20 MG capsule Take 20 mg by mouth 2 (two) times daily before a meal.    [provider]  simvastatin (ZOCOR) 40 MG tablet Take 40 mg by mouth daily.    [provider]  traZODone (DESYREL) 50 MG tablet Take 50-100 mg by mouth at bedtime.    [provider]  zolpidem (AMBIEN) 10 MG tablet Take 10 mg by mouth at bedtime as needed for sleep.    [provider]    Family History No family history on file.  Social History Social History   Tobacco Use   Smoking status: Never   Smokeless tobacco: Never  Substance Use Topics   Alcohol use: Yes    Comment: weekly   Drug use: No     Allergies   Cymbalta [duloxetine hcl], Duloxetine, Mold extract [trichophyton], Molds & smuts, Tramadol, and Ultram [tramadol hcl]   Review of Systems Review of Systems   Physical Exam Triage Vital Signs ED Triage Vitals [06/30/22 1037]  Enc Vitals Group     BP 124/81     Pulse  Rate 63     Resp      Temp 98.5 F (36.9 C)     Temp Source Oral     SpO2 96 %     Weight 160 lb (72.6 kg)     Height  (1.753 m)     Head Circumference      Peak Flow      Pain Score 0     Pain Loc      Pain Edu?      Excl. in GC?    No data found.  Updated Vital Signs BP 124/81 (BP Location: Right Arm)   Pulse 63   Temp 98.5 F (36.9 C) (Oral)   Ht  (1.753 m)   Wt 160 lb (72.6 kg)   SpO2 96%   BMI 23.63 kg/m   Visual Acuity Right Eye Distance:   Left Eye Distance:   Bilateral Distance:    Right Eye Near:   Left Eye Near:    Bilateral Near:     Physical Exam Constitutional:      Appearance: Normal appearance.  Eyes:     Extraocular  Movements: Extraocular movements intact.     Comments: Erythema to the right conjunctiva, vision is grossly intact, extraocular movements intact, no drainage present on exam  Pulmonary:     Effort: Pulmonary effort is normal.  Neurological:     Mental Status: He is alert and oriented to person, place, and time. Mental status is at baseline.      UC Treatments / Results  Labs (all labs ordered are listed, but only abnormal results are displayed) Labs Reviewed - No data to display  EKG   Radiology No results found.  Procedures Procedures (including critical care time)  Medications Ordered in UC Medications - No data to display  Initial Impression / Assessment and Plan / UC Course  I have reviewed the triage vital signs and the nursing notes.  Pertinent labs & imaging results that were available during my care of the patient were reviewed by me and considered in my medical decision making (see chart for details).  Bacterial conjunctivitis of right eye  Presentation and symptomology is consistent with above diagnoses, moxifloxacin prescribed, discussed administration, advised against direct eye touching, recommend over-the-counter analgesics, cool compresses and oral antihistamines for comfort, may follow-up with urgent care if symptoms persist or worsen Final Clinical Impressions(s) / UC Diagnoses   Final diagnoses:  None   Discharge Instructions   None    ED Prescriptions   None    PDMP not reviewed this encounter.   Valinda Hoar, NP 06/30/22 1057
